# Patient Record
Sex: Female | Born: 2000 | Race: Black or African American | Hispanic: No | Marital: Single | Smoking: Never smoker
Health system: Southern US, Community
[De-identification: ages and names within clinical notes are randomized; demographics above are authoritative.]

## PROBLEM LIST (undated history)

## (undated) ENCOUNTER — Emergency Department (HOSPITAL_BASED_OUTPATIENT_CLINIC_OR_DEPARTMENT_OTHER): Payer: No Typology Code available for payment source | Source: Home / Self Care

---
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
## 2019-06-09 ENCOUNTER — Other Ambulatory Visit: Payer: Self-pay

## 2019-06-09 ENCOUNTER — Encounter (HOSPITAL_BASED_OUTPATIENT_CLINIC_OR_DEPARTMENT_OTHER): Payer: Self-pay | Admitting: *Deleted

## 2019-06-09 ENCOUNTER — Emergency Department (HOSPITAL_BASED_OUTPATIENT_CLINIC_OR_DEPARTMENT_OTHER)
Admission: EM | Admit: 2019-06-09 | Discharge: 2019-06-09 | Disposition: A | Discharge status: Home / Self Care | Payer: Medicaid Other | Attending: Emergency Medicine | Admitting: Emergency Medicine

## 2019-06-09 DIAGNOSIS — G43909 Migraine, unspecified, not intractable, without status migrainosus: Secondary | ICD-10-CM | POA: Diagnosis not present

## 2019-06-09 DIAGNOSIS — Z7722 Contact with and (suspected) exposure to environmental tobacco smoke (acute) (chronic): Secondary | ICD-10-CM | POA: Insufficient documentation

## 2019-06-09 DIAGNOSIS — R519 Headache, unspecified: Secondary | ICD-10-CM

## 2019-06-09 DIAGNOSIS — J029 Acute pharyngitis, unspecified: Secondary | ICD-10-CM | POA: Diagnosis present

## 2019-06-09 LAB — GROUP A STREP BY PCR: Group A Strep by PCR: NOT DETECTED

## 2019-06-09 MED ORDER — ACETAMINOPHEN 325 MG PO TABS
650.0000 mg | ORAL_TABLET | Freq: Once | ORAL | Status: AC | PRN
  Administered 2019-06-09: 19:00:00 650 mg via ORAL
  Filled 2019-06-09: qty 2

## 2019-06-09 NOTE — Discharge Instructions (Signed)
Please read and follow all provided instructions.  Your diagnoses today include:  1. Pharyngitis, unspecified etiology   2. Acute nonintractable headache, unspecified headache type     Tests performed today include:  Strep test: was negative for strep throat  Vital signs. See below for your results today.   Medications prescribed:  Please use over-the-counter NSAID medications (ibuprofen, naproxen) as directed on the packaging for pain.   Home care instructions:  Please read the educational materials provided and follow any instructions contained in this packet.  Follow-up instructions: Please follow-up with your primary care provider as needed for further evaluation of your symptoms.  Return instructions:   Please return to the Emergency Department if you experience worsening symptoms.   Return if you have worsening problems swallowing, your neck becomes swollen, you cannot swallow your saliva or your voice becomes muffled.   Return with high persistent fever, persistent vomiting, or if you have trouble breathing.   Please return if you have any other emergent concerns.  Additional Information:  Your vital signs today were: BP 124/78 (BP Location: Right Arm)    Pulse (!) 109    Temp 100.2 F (37.9 C) (Oral)    Resp 20    Ht 5\' 8"  (1.727 m)    Wt 86.2 kg    LMP 05/19/2019 (Approximate)    SpO2 100%    BMI 28.89 kg/m  If your blood pressure (BP) was elevated above 135/85 this visit, please have this repeated by your doctor within one month. --------------

## 2019-06-09 NOTE — ED Provider Notes (Signed)
[facility] EMERGENCY DEPARTMENT Provider Note   CSN: [id number] Arrival date & time: 06/09/19  1834     History   Chief Complaint Chief Complaint  Patient presents with  . Sore Throat  . Migraine    HPI Elizabeth Hinton is a 18 y.o. female.     Patient presents to the emergency department today with complaint of sore throat and headache.  She states that her throat has been sore for 2 days.  After the sore throat began she had a flareup of her migraines.  She denies any fevers or chills but temperature is 100.2 F here.  Patient has been taking ibuprofen and Tylenol at home with temporary relief of symptoms.  No cough.  No reported coronavirus contacts.  Headache is generalized.  She has some mild light sensitivity.  No nausea or vomiting.  No head injury reported.  No confusion.  The onset of this condition was acute. The course is constant. Aggravating factors: none. Alleviating factors: none.       History reviewed. No pertinent past medical history.  There are no active problems to display for this patient.   History reviewed. No pertinent surgical history.   OB History   No obstetric history on file.      Home Medications    Prior to Admission medications   Not on File    Family History No family history on file.  Social History Social History   Tobacco Use  . Smoking status: Passive Smoke Exposure - Never Smoker  . Smokeless tobacco: Never Used  Substance Use Topics  . Alcohol use: Yes  . Drug use: Never     Allergies   Patient has no known allergies.   Review of Systems Review of Systems  Constitutional: Positive for fatigue. Negative for chills and fever.  HENT: Positive for sore throat. Negative for congestion, dental problem, ear pain, rhinorrhea and sinus pressure.   Eyes: Positive for photophobia. Negative for discharge, redness and visual disturbance.  Respiratory: Negative for cough, shortness of breath and wheezing.    Cardiovascular: Negative for chest pain.  Gastrointestinal: Negative for abdominal pain, diarrhea, nausea and vomiting.  Genitourinary: Negative for dysuria.  Musculoskeletal: Negative for gait problem, myalgias, neck pain and neck stiffness.  Skin: Negative for rash.  Neurological: Positive for headaches. Negative for syncope, speech difficulty, weakness, light-headedness and numbness.  Hematological: Negative for adenopathy.  Psychiatric/Behavioral: Negative for confusion.     Physical Exam Updated Vital Signs BP 124/78 (BP Location: Right Arm)   Pulse (!) 109   Temp 100.2 F (37.9 C) (Oral)   Resp 20   Ht 5\' 8"  (1.727 m)   Wt 86.2 kg   LMP 05/19/2019 (Approximate)   SpO2 100%   BMI 28.89 kg/m   Physical Exam Vitals signs and nursing note reviewed.  Constitutional:      Appearance: She is well-developed.  HENT:     Head: Normocephalic and atraumatic.     Jaw: No trismus.     Right Ear: Tympanic membrane, ear canal and external ear normal.     Left Ear: Tympanic membrane, ear canal and external ear normal.     Nose: Nose normal. No mucosal edema or rhinorrhea.     Mouth/Throat:     Mouth: Mucous membranes are not dry. No oral lesions.     Pharynx: Uvula midline. Posterior oropharyngeal erythema present. No oropharyngeal exudate or uvula swelling.     Tonsils: No tonsillar exudate or tonsillar abscesses. 3+  on the right. 3+ on the left.  Eyes:     General: Lids are normal.        Right eye: No discharge.        Left eye: No discharge.     Extraocular Movements:     Right eye: No nystagmus.     Left eye: No nystagmus.     Conjunctiva/sclera: Conjunctivae normal.     Pupils: Pupils are equal, round, and reactive to light.  Neck:     Musculoskeletal: Normal range of motion and neck supple.  Cardiovascular:     Rate and Rhythm: Normal rate and regular rhythm.     Heart sounds: Normal heart sounds.  Pulmonary:     Effort: Pulmonary effort is normal. No respiratory  distress.     Breath sounds: Normal breath sounds. No wheezing or rales.  Abdominal:     Palpations: Abdomen is soft.     Tenderness: There is no abdominal tenderness.  Musculoskeletal:     Cervical back: She exhibits normal range of motion, no tenderness and no bony tenderness.  Lymphadenopathy:     Cervical: No cervical adenopathy.  Skin:    General: Skin is warm and dry.  Neurological:     Mental Status: She is alert and oriented to person, place, and time.     GCS: GCS eye subscore is 4. GCS verbal subscore is 5. GCS motor subscore is 6.     Cranial Nerves: No cranial nerve deficit.     Sensory: No sensory deficit.     Coordination: Coordination normal.     Gait: Gait normal.     Deep Tendon Reflexes: Reflexes are normal and symmetric.      ED Treatments / Results  Labs (all labs ordered are listed, but only abnormal results are displayed) Labs Reviewed  GROUP A STREP BY PCR    EKG None  Radiology No results found.  Procedures Procedures (including critical care time)  Medications Ordered in ED Medications  acetaminophen (TYLENOL) tablet 650 mg (650 mg Oral Given 06/09/19 1854)     Initial Impression / Assessment and Plan / ED Course  I have reviewed the triage vital signs and the nursing notes.  Pertinent labs & imaging results that were available during my care of the patient were reviewed by me and considered in my medical decision making (see chart for details).        Patient seen and examined. Work-up initiated.  Patient was given Tylenol.  Offered additional medications for headache, she states that her head does not really hurt that much right now and she declines.  Offered coronavirus testing if strep test is negative, she declines.  Will reassess after strep testing returns.  Vital signs reviewed and are as follows: BP 124/78 (BP Location: Right Arm)   Pulse (!) 109   Temp 100.2 F (37.9 C) (Oral)   Resp 20   Ht 5\' 8"  (1.727 m)   Wt 86.2 kg    LMP 05/19/2019 (Approximate)   SpO2 100%   BMI 28.89 kg/m   7:48 PM Patient stable.  Informed of negative strep results.  She states that she will agree to chronic testing only if it is the test that doesn't go "back to the brain".  Unfortunately we do not have these tests available.  Discussed use of Tylenol and NSAIDs.  Patient counseled on supportive care and s/s to return including worsening symptoms, trouble breathing or shortness of breath, persistent fever, persistent vomiting, or  if they have any other concerns. Urged to see PCP if symptoms persist for more than 3 days. Patient verbalizes understanding and agrees with plan.    Final Clinical Impressions(s) / ED Diagnoses   Final diagnoses:  Pharyngitis, unspecified etiology  Acute nonintractable headache, unspecified headache type   Patient with pharyngitis, negative strep.  No signs of peritonsillar abscess or other complication.  Also with headache that is currently improved after Tylenol.  No neuro symptoms.  No meningismus.  Comfortable discharged home.  Patient declines coronavirus testing or additional medication for headache at this time.   ED Discharge Orders    None       Carlisle Cater, PA-C 06/09/19 1950    Little, Wenda Overland, MD 06/09/19 [phone]

## 2019-06-09 NOTE — ED Triage Notes (Signed)
Pt reports sore throat x 2 days and recurrent migraine

## 2020-09-17 ENCOUNTER — Emergency Department (HOSPITAL_BASED_OUTPATIENT_CLINIC_OR_DEPARTMENT_OTHER): Payer: Medicaid Other

## 2020-09-17 ENCOUNTER — Other Ambulatory Visit: Payer: Self-pay

## 2020-09-17 ENCOUNTER — Encounter (HOSPITAL_BASED_OUTPATIENT_CLINIC_OR_DEPARTMENT_OTHER): Payer: Self-pay | Admitting: Emergency Medicine

## 2020-09-17 ENCOUNTER — Emergency Department (HOSPITAL_BASED_OUTPATIENT_CLINIC_OR_DEPARTMENT_OTHER)
Admission: EM | Admit: 2020-09-17 | Discharge: 2020-09-17 | Disposition: A | Discharge status: Home / Self Care | Payer: Medicaid Other | Attending: Emergency Medicine | Admitting: Emergency Medicine

## 2020-09-17 DIAGNOSIS — Z7722 Contact with and (suspected) exposure to environmental tobacco smoke (acute) (chronic): Secondary | ICD-10-CM | POA: Diagnosis not present

## 2020-09-17 DIAGNOSIS — R079 Chest pain, unspecified: Secondary | ICD-10-CM | POA: Diagnosis not present

## 2020-09-17 DIAGNOSIS — R0789 Other chest pain: Secondary | ICD-10-CM

## 2020-09-17 NOTE — ED Provider Notes (Signed)
[facility] EMERGENCY DEPARTMENT Provider Note   CSN: [id number] Arrival date & time: 09/17/20  4270     History Chief Complaint  Patient presents with  . Rib Injury    Elizabeth Hinton is a 20 y.o. female.  Patient is a 20 year old female with no significant past medical history.  Patient presents with complaints of pain to the left chest.  This has been occurring intermittently for the past several days.  It seems to begin when she breathes or moves a certain way.  The pain is sharp in nature and worse when she moves or takes a deep breath.  She denies any fevers, chills, or cough.  She denies any leg pain or swelling.  The history is provided by the patient.       History reviewed. No pertinent past medical history.  There are no problems to display for this patient.   History reviewed. No pertinent surgical history.   OB History   No obstetric history on file.     No family history on file.  Social History   Tobacco Use  . Smoking status: Passive Smoke Exposure - Never Smoker  . Smokeless tobacco: Never Used  Vaping Use  . Vaping Use: Never used  Substance Use Topics  . Alcohol use: Yes  . Drug use: Never    Home Medications Prior to Admission medications   Not on File    Allergies    Patient has no known allergies.  Review of Systems   Review of Systems  All other systems reviewed and are negative.   Physical Exam Updated Vital Signs BP 131/72 (BP Location: Left Arm)   Pulse 90   Temp 98.5 F (36.9 C) (Oral)   Resp 18   Ht 5\' 8"  (1.727 m)   Wt 90.7 kg   LMP 09/17/2020   SpO2 100%   BMI 30.41 kg/m   Physical Exam Vitals and nursing note reviewed.  Constitutional:      General: She is not in acute distress.    Appearance: She is well-developed and well-nourished. She is not diaphoretic.  HENT:     Head: Normocephalic and atraumatic.  Cardiovascular:     Rate and Rhythm: Normal rate and regular rhythm.     Heart sounds: No  murmur heard. No friction rub. No gallop.   Pulmonary:     Effort: Pulmonary effort is normal. No respiratory distress.     Breath sounds: Normal breath sounds. No wheezing.     Comments: There is tenderness to palpation of the left anterior lower ribs.  This reproduces her symptoms. Abdominal:     General: Bowel sounds are normal. There is no distension.     Palpations: Abdomen is soft.     Tenderness: There is no abdominal tenderness.  Musculoskeletal:        General: No swelling or tenderness. Normal range of motion.     Cervical back: Normal range of motion and neck supple.     Right lower leg: No edema.     Left lower leg: No edema.     Comments: 09/19/2020' sign is absent bilaterally.  Skin:    General: Skin is warm and dry.  Neurological:     Mental Status: She is alert and oriented to person, place, and time.     ED Results / Procedures / Treatments   Labs (all labs ordered are listed, but only abnormal results are displayed) Labs Reviewed - No data to display  EKG  EKG Interpretation  Date/Time:  Thursday September 17 2020 01:44:02 EST Ventricular Rate:  79 PR Interval:    QRS Duration: 88 QT Interval:  376 QTC Calculation: 431 R Axis:   40 Text Interpretation: Sinus rhythm Normal ECG Confirmed by Geoffery Lyons ([id number]) on 09/17/2020 1:46:38 AM   Radiology No results found.  Procedures Procedures   Medications Ordered in ED Medications - No data to display  ED Course  I have reviewed the triage vital signs and the nursing notes.  Pertinent labs & imaging results that were available during my care of the patient were reviewed by me and considered in my medical decision making (see chart for details).    MDM Rules/Calculators/A&P  Patient presenting here with complaints of pain to her left chest wall.  It is reproducible with palpation and most consistent with costochondritis.  Patient to be discharged with NSAIDs, rest, and return as needed.  Her EKG is  unremarkable and chest x-ray is clear.  I highly doubt a cardiac etiology and do not feel as though further work-up is indicated into this.  Patient is 20 years old and has no risk factors.  I have also considered, but highly doubt pulmonary embolism.  She has no hypoxia, no tachycardia, and symptoms are reproducible with palpation.  Final Clinical Impression(s) / ED Diagnoses Final diagnoses:  None    Rx / DC Orders ED Discharge Orders    None       Geoffery Lyons, MD 09/17/20 [phone]

## 2020-09-17 NOTE — ED Triage Notes (Signed)
Pt c/o left side rib pain that is worse upon exhaling and moving in certain ways. Pt denies any trauma or injury to area. Pt started yesterday but became worse today. Pt aaox3, ambulatory with steady gait, VSS, GCS 15, no respiratory distress noted in triage.

## 2020-09-17 NOTE — Discharge Instructions (Addendum)
Take ibuprofen 600 mg 3 times daily for the next 5 days.  Rest.  Follow-up with your primary doctor if symptoms are not improving in the next week, and return to the ER if you develop worsening pain, difficulty breathing, or other new and concerning symptoms.

## 2020-09-17 NOTE — ED Notes (Signed)
Patient transported to X-ray 

## 2021-06-01 ENCOUNTER — Emergency Department (HOSPITAL_COMMUNITY)
Admission: EM | Admit: 2021-06-01 | Discharge: 2021-06-02 | Disposition: A | Discharge status: Home / Self Care | Payer: Medicaid Other | Attending: Emergency Medicine | Admitting: Emergency Medicine

## 2021-06-01 ENCOUNTER — Other Ambulatory Visit: Payer: Self-pay

## 2021-06-01 ENCOUNTER — Encounter (HOSPITAL_COMMUNITY): Payer: Self-pay | Admitting: Emergency Medicine

## 2021-06-01 DIAGNOSIS — Z20822 Contact with and (suspected) exposure to covid-19: Secondary | ICD-10-CM | POA: Diagnosis not present

## 2021-06-01 DIAGNOSIS — Z7722 Contact with and (suspected) exposure to environmental tobacco smoke (acute) (chronic): Secondary | ICD-10-CM | POA: Diagnosis not present

## 2021-06-01 DIAGNOSIS — J101 Influenza due to other identified influenza virus with other respiratory manifestations: Secondary | ICD-10-CM | POA: Diagnosis not present

## 2021-06-01 DIAGNOSIS — R509 Fever, unspecified: Secondary | ICD-10-CM | POA: Diagnosis present

## 2021-06-01 LAB — RESP PANEL BY RT-PCR (FLU A&B, COVID) ARPGX2
Influenza A by PCR: POSITIVE — AB
Influenza B by PCR: NEGATIVE
SARS Coronavirus 2 by RT PCR: NEGATIVE

## 2021-06-01 MED ORDER — IBUPROFEN 400 MG PO TABS
400.0000 mg | ORAL_TABLET | Freq: Once | ORAL | Status: AC
  Administered 2021-06-02: 400 mg via ORAL
  Filled 2021-06-01: qty 1

## 2021-06-01 NOTE — ED Triage Notes (Signed)
Patient reports fever with fatigue/generalized weakness / jaw pain onset this afternoon .

## 2021-06-01 NOTE — ED Provider Notes (Signed)
Emergency Medicine Provider Triage Evaluation Note  Elizabeth Hinton , a 20 y.o. female  was evaluated in triage.  Pt complains of fever and fatigue, generalized weakness.  She states that this started at noon today.  She has had associated sore throat and subjective shortness of breath.  She states that she did not take her temperature at home just felt warm.  She states that her siblings were sick 1 week ago with the flu.  She denies any cough, congestion, nausea, vomiting or diarrhea.  She is still tolerating fluids and solid food.  Review of Systems  Positive: Fever Negative: Cough  Physical Exam  Ht 5\' 8"  (1.727 m)   Wt 100 kg   BMI 33.52 kg/m  Gen:   Awake, no distress   Resp:  Normal effort, lung sounds clear MSK:   Moves extremities without difficulty  Other:    Medical Decision Making  Medically screening exam initiated at 8:52 PM.  Appropriate orders placed.  Ariell Gunnels was informed that the remainder of the evaluation will be completed by another provider, this initial triage assessment does not replace that evaluation, and the importance of remaining in the ED until their evaluation is complete.     Trudee Grip, PA-C 06/01/21 2053    2054, MD 06/02/21 [phone]

## 2021-06-02 NOTE — ED Provider Notes (Signed)
[facility] EMERGENCY DEPARTMENT Provider Note   CSN: [id number] Arrival date & time: 06/01/21  1956     History Chief Complaint  Patient presents with   Fatigue Maia Plan    Noreta Kue is a 20 y.o. female.  The history is provided by the patient.  Influenza Presenting symptoms: fatigue, fever, headache, myalgias, nausea and sore throat   Presenting symptoms: no cough, no diarrhea, no shortness of breath and no vomiting   Severity:  Moderate Onset quality:  Gradual Duration:  12 hours Progression:  Worsening Chronicity:  New Relieved by:  Nothing Worsened by:  Nothing Associated symptoms: chills       PMH-none OB History   No obstetric history on file.     No family history on file.  Social History   Tobacco Use   Smoking status: Passive Smoke Exposure - Never Smoker   Smokeless tobacco: Never  Vaping Use   Vaping Use: Never used  Substance Use Topics   Alcohol use: Yes   Drug use: Never    Home Medications Prior to Admission medications   Not on File    Allergies    Patient has no known allergies.  Review of Systems   Review of Systems  Constitutional:  Positive for chills, fatigue and fever.  HENT:  Positive for sore throat.   Respiratory:  Negative for cough and shortness of breath.   Gastrointestinal:  Positive for nausea. Negative for diarrhea and vomiting.  Musculoskeletal:  Positive for myalgias.  Neurological:  Positive for headaches.  All other systems reviewed and are negative.  Physical Exam Updated Vital Signs BP 134/75 (BP Location: Left Arm)   Pulse (!) 103   Temp (!) 100.4 F (38 C) (Oral)   Resp 20   Ht 1.727 m (5\' 8" )   Wt 100 kg   SpO2 100%   BMI 33.52 kg/m   Physical Exam CONSTITUTIONAL: Well developed/well nourished HEAD: Normocephalic/atraumatic EYES: EOMI/PERRL ENMT: Mucous membranes moist, uvula midline no erythema or exudate NECK: supple no meningeal signs SPINE/BACK:entire spine  nontender CV: S1/S2 noted, no murmurs/rubs/gallops noted LUNGS: Lungs are clear to auscultation bilaterally, no apparent distress ABDOMEN: soft, nontender NEURO: Pt is awake/alert/appropriate, moves all extremitiesx4.  No facial droop.   EXTREMITIES: pulses normal/equal, full ROM SKIN: warm, color normal PSYCH: no abnormalities of mood noted, alert and oriented to situation  ED Results / Procedures / Treatments   Labs (all labs ordered are listed, but only abnormal results are displayed) Labs Reviewed  RESP PANEL BY RT-PCR (FLU A&B, COVID) ARPGX2 - Abnormal; Notable for the following components:      Result Value   Influenza A by PCR POSITIVE (*)    All other components within normal limits    EKG None  Radiology No results found.  Procedures Procedures   Medications Ordered in ED Medications  ibuprofen (ADVIL) tablet 400 mg (has no administration in time range)    ED Course  I have reviewed the triage vital signs and the nursing notes.  Pertinent labs  results that were available during my care of the patient were reviewed by me and considered in my medical decision making (see chart for details).    MDM Rules/Calculators/A&P                           Patient is otherwise healthy female presenting with flulike illness.  Patient is positive for influenza A.  She is safe for  discharge home and outpatient management.  She has no hypoxia.  No signs of meningitis. We discussed strict  return precautions Final Clinical Impression(s) / ED Diagnoses Final diagnoses:  Influenza A    Rx / DC Orders ED Discharge Orders     None        Zadie Rhine, MD 06/02/21 0025

## 2021-09-09 ENCOUNTER — Other Ambulatory Visit: Payer: Self-pay

## 2021-09-09 ENCOUNTER — Emergency Department (HOSPITAL_BASED_OUTPATIENT_CLINIC_OR_DEPARTMENT_OTHER): Payer: Medicaid Other | Admitting: Radiology

## 2021-09-09 ENCOUNTER — Emergency Department (HOSPITAL_BASED_OUTPATIENT_CLINIC_OR_DEPARTMENT_OTHER)
Admission: EM | Admit: 2021-09-09 | Discharge: 2021-09-09 | Disposition: A | Discharge status: Home / Self Care | Payer: Medicaid Other | Attending: Emergency Medicine | Admitting: Emergency Medicine

## 2021-09-09 ENCOUNTER — Encounter (HOSPITAL_BASED_OUTPATIENT_CLINIC_OR_DEPARTMENT_OTHER): Payer: Self-pay | Admitting: Emergency Medicine

## 2021-09-09 DIAGNOSIS — M79672 Pain in left foot: Secondary | ICD-10-CM | POA: Insufficient documentation

## 2021-09-09 MED ORDER — NAPROXEN 500 MG PO TABS: 500.0000 mg | ORAL_TABLET | Freq: Two times a day (BID) | ORAL | 0 refills | Status: DC

## 2021-09-09 NOTE — ED Provider Notes (Signed)
[facility] EMERGENCY DEPT Provider Note   CSN: [id number] Arrival date & time: 09/09/21  1412    History  Chief Complaint  Patient presents with   Foot Pain    Elizabeth Hinton is a 21 y.o. female no significant past medical history here for evaluation of left foot pain.  Initially injured foot approximately 2 months ago.  Has had intermittent pain to the plantar aspect of foot since.  Pain does not extend into ankle, tib-fib.  Describes as aching and throbbing to the left medial aspect plantar foot.  No paresthesias, weakness, redness, warmth, numbness.  No meds PTA. Pain worse after standing for long periods or when first getting up in the morning  HPI     Home Medications Prior to Admission medications   Medication Sig Start Date End Date Taking? Authorizing Provider  naproxen (NAPROSYN) 500 MG tablet Take 1 tablet (500 mg total) by mouth 2 (two) times daily. 09/09/21  Yes Damir Leung A, PA-C      Allergies    Patient has no known allergies.    Review of Systems   Review of Systems  Constitutional: Negative.   HENT: Negative.    Respiratory: Negative.    Cardiovascular: Negative.   Gastrointestinal: Negative.   Genitourinary: Negative.   Musculoskeletal:        Left foot pain  Skin: Negative.   Neurological: Negative.   All other systems reviewed and are negative.  Physical Exam Updated Vital Signs BP 136/68 (BP Location: Right Arm)    Pulse 74    Temp 98.8 F (37.1 C) (Oral)    Resp 17    Ht 5\' 8"  (1.727 m)    Wt 90.7 kg    SpO2 100%    BMI 30.41 kg/m  Physical Exam Vitals and nursing note reviewed.  Constitutional:      General: She is not in acute distress.    Appearance: She is well-developed. She is not ill-appearing, toxic-appearing or diaphoretic.  HENT:     Head: Normocephalic and atraumatic.     Nose: Nose normal.     Mouth/Throat:     Mouth: Mucous membranes are moist.  Eyes:     Pupils: Pupils are equal, round, and reactive to  light.  Cardiovascular:     Rate and Rhythm: Normal rate.     Pulses: Normal pulses.          Dorsalis pedis pulses are 2+ on the right side and 2+ on the left side.       Posterior tibial pulses are 2+ on the right side and 2+ on the left side.  Pulmonary:     Effort: No respiratory distress.  Abdominal:     General: There is no distension.  Musculoskeletal:        General: Normal range of motion.     Cervical back: Normal range of motion.       Feet:     Comments: Minimal tenderness to left lower extremity medial plantar fascia.  Nontender bilateral ankles, tib-fib.  Full range of motion without difficulty.  Feet:     Right foot:     Skin integrity: Skin integrity normal.     Left foot:     Skin integrity: Skin integrity normal.     Comments: Pes planus bilaterally Skin:    General: Skin is warm and dry.     Capillary Refill: Capillary refill takes less than 2 seconds.     Comments: No edema, erythema, warmth,  fluctuance or induration.  Neurological:     General: No focal deficit present.     Mental Status: She is alert and oriented to person, place, and time.     Cranial Nerves: Cranial nerves 2-12 are intact.     Sensory: Sensation is intact.     Motor: Motor function is intact.     Gait: Gait is intact.  Psychiatric:        Mood and Affect: Mood normal.    ED Results / Procedures / Treatments   Labs (all labs ordered are listed, but only abnormal results are displayed) Labs Reviewed - No data to display  EKG None  Radiology DG Foot Complete Left  Result Date: 09/09/2021 CLINICAL DATA:  Left foot injury EXAM: LEFT FOOT - COMPLETE 3+ VIEW COMPARISON:  None. FINDINGS: There is no evidence of fracture or dislocation. There is no evidence of arthropathy or other focal bone abnormality. Soft tissues are unremarkable. IMPRESSION: No acute osseous abnormality identified. Electronically Signed   By: Jannifer Hick M.D.   On: 09/09/2021 15:08    Procedures Procedures     Medications Ordered in ED Medications - No data to display  ED Course/ Medical Decision Making/ A&P    21 year old here for evaluation of left plantar aspect foot pain x 2 months.  Worse after standing as well as in the morning after being off her foot.  Apparently had injury at initial onset.  Patient neurovascularly intact.  No edema, erythema or warmth.  Full range of motion.  Does have some mild tenderness at plantar medial aspect left foot.  No overlying skin changes to suggest cellulitis. Does have severe pes planus.  Labs and imaging personally reviewed and interpreted:  Xray left foot without acute abnormality  Patient reassessed.  Discussed imaging results.  Suspect patient's pain from plantar fasciitis versus pes planus.  Discussed RICE, frozen water bottle rolls, anti-inflammatories, good shoe inserts with supportive arch, close follow-up with podiatry.  Patient agreeable.  At this time I have low suspicion for acute fracture, dislocation, ischemia, bacterial infectious process, vte.  The patient has been appropriately medically screened and/or stabilized in the ED. I have low suspicion for any other emergent medical condition which would require further screening, evaluation or treatment in the ED or require inpatient management.  Patient is hemodynamically stable and in no acute distress.  Patient able to ambulate in department prior to ED.  Evaluation does not show acute pathology that would require ongoing or additional emergent interventions while in the emergency department or further inpatient treatment.  I have discussed the diagnosis with the patient and answered all questions.  Pain is been managed while in the emergency department and patient has no further complaints prior to discharge.  Patient is comfortable with plan discussed in room and is stable for discharge at this time.  I have discussed strict return precautions for returning to the emergency department.   Patient was encouraged to follow-up with PCP/specialist refer to at discharge.                            Medical Decision Making Amount and/or Complexity of Data Reviewed External Data Reviewed: labs, radiology and notes. Radiology: ordered and independent interpretation performed. Decision-making details documented in ED Course.  Risk OTC drugs. Prescription drug management. Risk Details: Do not feel patient needs additional labs, imaging, hospitalization at this time  Final Clinical Impression(s) / ED Diagnoses Final diagnoses:  Left foot pain    Rx / DC Orders ED Discharge Orders          Ordered    naproxen (NAPROSYN) 500 MG tablet  2 times daily        09/09/21 1528              Bryony Kaman A, PA-C 09/09/21 1538    Long, Arlyss Repress, MD 09/10/21 [phone]

## 2021-09-09 NOTE — Discharge Instructions (Addendum)
Use the anti-inflammatory as prescribed.  Keep your foot elevated, ice, follow-up with primary care provider or orthopedics if your symptoms do not improve  Return for new or worsening symptoms

## 2021-09-09 NOTE — ED Triage Notes (Signed)
Pt arrives to ED with c/o left foot pain. She reports this x2 months ago after landing on the foot and twisting it. The pain is on the right side of the foot. Pt reports the pain has worsened this week. The pain is described as throbbing.

## 2021-12-30 ENCOUNTER — Encounter (HOSPITAL_BASED_OUTPATIENT_CLINIC_OR_DEPARTMENT_OTHER): Payer: Self-pay | Admitting: *Deleted

## 2021-12-30 ENCOUNTER — Emergency Department (HOSPITAL_BASED_OUTPATIENT_CLINIC_OR_DEPARTMENT_OTHER)
Admission: EM | Admit: 2021-12-30 | Discharge: 2021-12-30 | Disposition: A | Discharge status: Home / Self Care | Payer: Medicaid Other | Attending: Emergency Medicine | Admitting: Emergency Medicine

## 2021-12-30 ENCOUNTER — Other Ambulatory Visit: Payer: Self-pay

## 2021-12-30 DIAGNOSIS — M546 Pain in thoracic spine: Secondary | ICD-10-CM | POA: Diagnosis not present

## 2021-12-30 DIAGNOSIS — Y92410 Unspecified street and highway as the place of occurrence of the external cause: Secondary | ICD-10-CM | POA: Insufficient documentation

## 2021-12-30 DIAGNOSIS — M25511 Pain in right shoulder: Secondary | ICD-10-CM | POA: Diagnosis not present
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
## 2021-12-30 NOTE — Discharge Instructions (Addendum)
You may use over-the-counter Motrin (Ibuprofen), Acetaminophen (Tylenol), topical muscle creams such as SalonPas, Icy Hot, Bengay, etc. Please stretch, apply ice or heat (whichever helps), and have massage therapy for additional assistance.  

## 2023-04-08 ENCOUNTER — Emergency Department (HOSPITAL_BASED_OUTPATIENT_CLINIC_OR_DEPARTMENT_OTHER)
Admission: EM | Admit: 2023-04-08 | Discharge: 2023-04-08 | Disposition: A | Discharge status: Home / Self Care | Payer: Self-pay | Attending: Emergency Medicine | Admitting: Emergency Medicine

## 2023-04-08 ENCOUNTER — Emergency Department (HOSPITAL_BASED_OUTPATIENT_CLINIC_OR_DEPARTMENT_OTHER): Payer: Self-pay

## 2023-04-08 ENCOUNTER — Other Ambulatory Visit: Payer: Self-pay

## 2023-04-08 ENCOUNTER — Encounter (HOSPITAL_BASED_OUTPATIENT_CLINIC_OR_DEPARTMENT_OTHER): Payer: Self-pay

## 2023-04-08 DIAGNOSIS — S9782XA Crushing injury of left foot, initial encounter: Secondary | ICD-10-CM | POA: Insufficient documentation

## 2023-04-08 DIAGNOSIS — W208XXA Other cause of strike by thrown, projected or falling object, initial encounter: Secondary | ICD-10-CM | POA: Insufficient documentation

## 2023-04-08 MED ORDER — NAPROXEN 500 MG PO TABS: 500.0000 mg | ORAL_TABLET | Freq: Two times a day (BID) | ORAL | 0 refills | Status: AC

## 2023-04-08 NOTE — Discharge Instructions (Signed)
The x-ray of your left foot did not show any fracture or dislocation.  You were given a referral for orthopedics if you wish to have more follow-up.  Please take the anti-inflammatory that were prescribed to you 1 tablet twice a day for the next 7 days with food.

## 2023-04-08 NOTE — ED Provider Notes (Signed)
[place] EMERGENCY DEPARTMENT AT [facility] Provider Note   CSN: [id number] Arrival date & time: 04/08/23  1753     History  Chief Complaint  Patient presents with   Foot Injury    left    Elizabeth Hinton is a 22 y.o. female.  22 year old female with no past medical history presents to the ED status post left foot injury which occurred 2 weeks ago.  Patient reports she dropped a heavy rotary onto her left foot.  Since then she has had pain to the dorsum aspect, has a visible knot to the area.  Exacerbated with movement along with weightbearing.  Has taken some ibuprofen with some improvement in symptoms.  No numbness sensation, no tingling, no fevers.  The history is provided by the patient.  Foot Injury Associated symptoms: no fever        Home Medications Prior to Admission medications   Medication Sig Start Date End Date Taking? Authorizing Provider  naproxen (NAPROSYN) 500 MG tablet Take 1 tablet (500 mg total) by mouth 2 (two) times daily for 7 days. 04/08/23 04/15/23 Yes Claude Manges, PA-C      Allergies    Patient has no known allergies.    Review of Systems   Review of Systems  Constitutional:  Negative for fever.  Musculoskeletal:  Positive for arthralgias.    Physical Exam Updated Vital Signs BP 128/79 (BP Location: Right Arm)   Pulse 86   Temp 98 F (36.7 C)   Resp 20   Ht 5\' 8"  (1.727 m)   Wt 95.3 kg   SpO2 99%   BMI 31.93 kg/m  Physical Exam Vitals and nursing note reviewed.  Constitutional:      Appearance: Normal appearance.  HENT:     Head: Normocephalic and atraumatic.     Mouth/Throat:     Mouth: Mucous membranes are moist.  Cardiovascular:     Rate and Rhythm: Normal rate.     Pulses:          Popliteal pulses are 2+ on the left side.       Dorsalis pedis pulses are 2+ on the left side.       Posterior tibial pulses are 2+ on the left side.  Pulmonary:     Effort: Pulmonary effort is normal.  Abdominal:     General:  Abdomen is flat.  Musculoskeletal:     Cervical back: Normal range of motion and neck supple.  Feet:     Left foot:     Skin integrity: Skin integrity normal. No skin breakdown or erythema.     Comments: 2+ DP, PT pulses, capillary refill is intact.  Sensation is intact throughout. Skin:    General: Skin is warm and dry.  Neurological:     Mental Status: She is alert and oriented to person, place, and time.     ED Results / Procedures / Treatments   Labs (all labs ordered are listed, but only abnormal results are displayed) Labs Reviewed - No data to display  EKG None  Radiology DG Foot Complete Left  Result Date: 04/08/2023 CLINICAL DATA:  Left foot pain. Dropped a heavy rug onto her foot 2 weeks ago. EXAM: LEFT FOOT - COMPLETE 3+ VIEW COMPARISON:  09/09/2021. FINDINGS: There is no evidence of fracture or dislocation. There is no evidence of arthropathy or other focal bone abnormality. Soft tissues are unremarkable. IMPRESSION: Negative. Electronically Signed   By: Amie Portland M.D.   On: 04/08/2023 18:53  Procedures Procedures    Medications Ordered in ED Medications - No data to display  ED Course/ Medical Decision Making/ A&P                                 Medical Decision Making Amount and/or Complexity of Data Reviewed Radiology: ordered.  Risk Prescription drug management.    Patient here status post left foot injury approximately 2 weeks ago, no visible erythema, hematoma noted on my exam.  She is neurovascularly intact.  X-ray does not show any dislocation versus fracture.  She does have a knot to the dorsum aspect of her foot.  We discussed the results of her xray at length, she will go home with an Ace wrap, close follow-up with orthopedics if symptoms worsen.  She is agreeable to plan and treatment, hemodynamically stable for discharge.  Portions of this note were generated with Scientist, clinical (histocompatibility and immunogenetics). Dictation errors may occur despite best  attempts at proofreading.   Final Clinical Impression(s) / ED Diagnoses Final diagnoses:  Crushing injury of left foot, initial encounter    Rx / DC Orders ED Discharge Orders          Ordered    naproxen (NAPROSYN) 500 MG tablet  2 times daily        04/08/23 1915              Claude Manges, PA-C 04/08/23 1919    Charlynne Pander, MD 04/08/23 2306

## 2023-04-08 NOTE — ED Triage Notes (Signed)
Patient arrives with complaints of left foot pain due to dropping a heavy rug on her foot 2 weeks ago.

## 2023-12-18 IMAGING — DX DG FOOT COMPLETE 3+V*L*
3 series · 3 of 3 positions shown · non-contrast
Comparison: None.

CLINICAL DATA: Left foot injury

EXAM:
LEFT FOOT - COMPLETE 3+ VIEW

[foot ap]
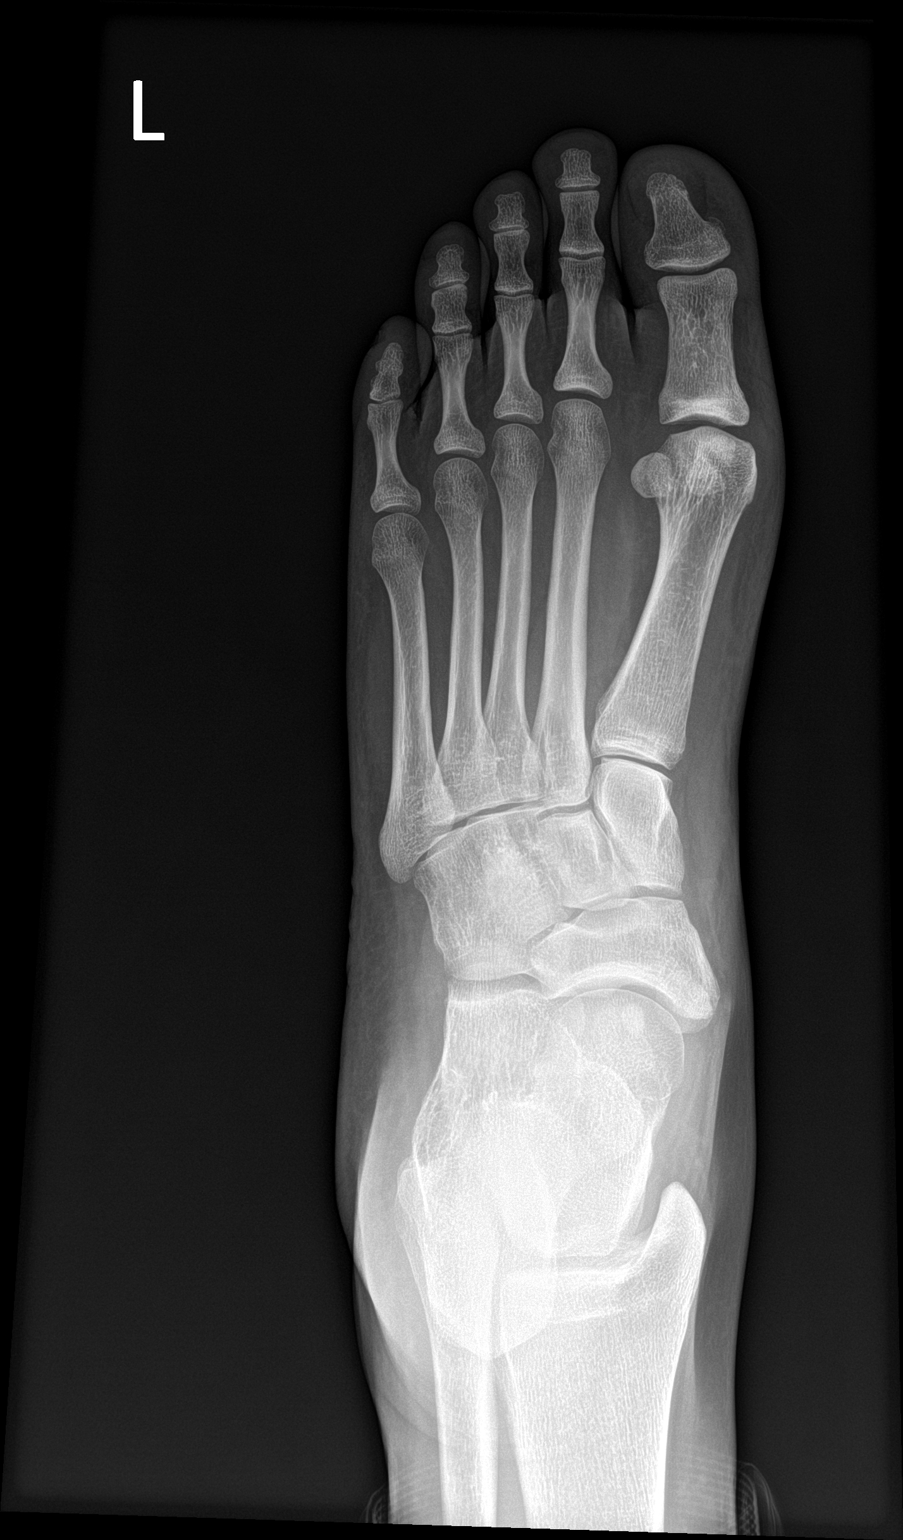

[foot obl]
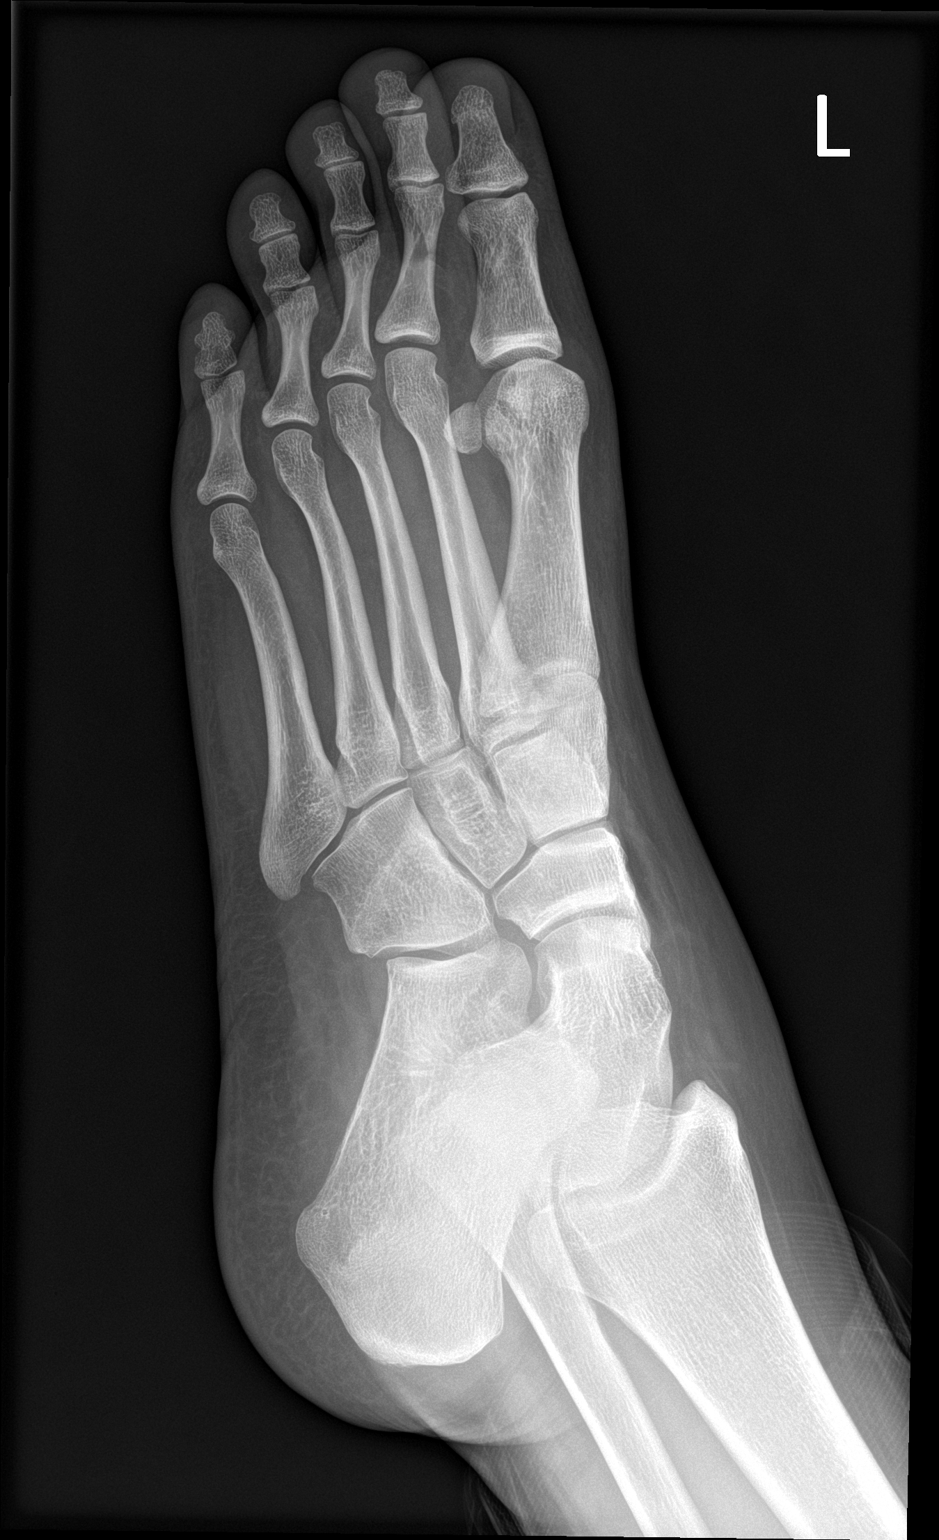

[foot lat]
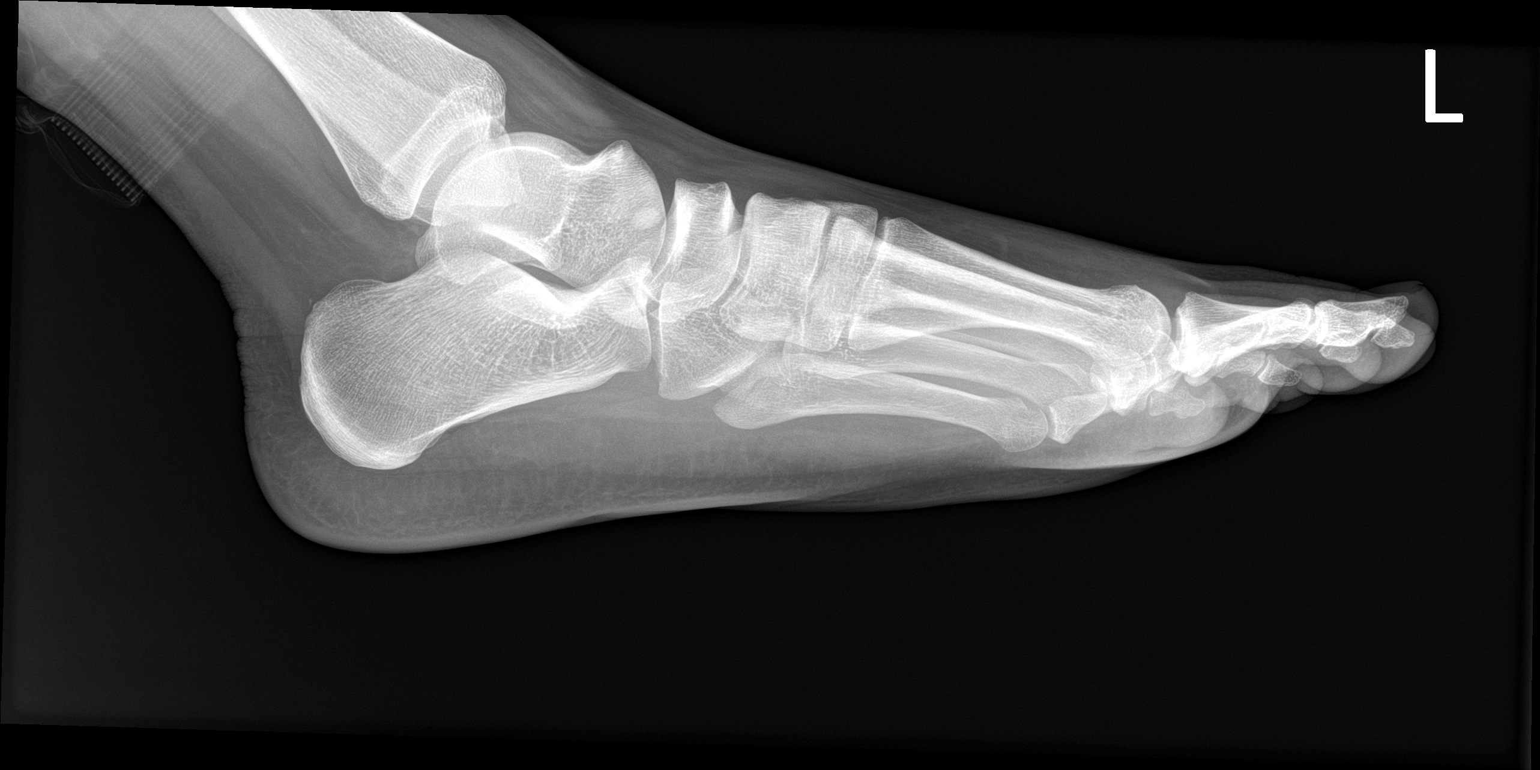

[3 of 3 positions shown; findings below may reference images not displayed]

FINDINGS: There is no evidence of fracture or dislocation. There is no
evidence of arthropathy or other focal bone abnormality. Soft
tissues are unremarkable.
IMPRESSION: No acute osseous abnormality identified.

## 2024-03-03 ENCOUNTER — Encounter (HOSPITAL_BASED_OUTPATIENT_CLINIC_OR_DEPARTMENT_OTHER): Payer: Self-pay

## 2024-03-03 ENCOUNTER — Emergency Department (HOSPITAL_BASED_OUTPATIENT_CLINIC_OR_DEPARTMENT_OTHER): Admission: EM | Admit: 2024-03-03 | Discharge: 2024-03-03 | Disposition: A | Discharge status: Home / Self Care

## 2024-03-03 DIAGNOSIS — U071 COVID-19: Secondary | ICD-10-CM | POA: Insufficient documentation

## 2024-03-03 DIAGNOSIS — R509 Fever, unspecified: Secondary | ICD-10-CM | POA: Diagnosis present

## 2024-03-03 LAB — RESP PANEL BY RT-PCR (RSV, FLU A&B, COVID)  RVPGX2
Influenza A by PCR: NEGATIVE
Influenza B by PCR: NEGATIVE
Resp Syncytial Virus by PCR: NEGATIVE
SARS Coronavirus 2 by RT PCR: POSITIVE — AB

## 2024-03-03 LAB — GROUP A STREP BY PCR: Group A Strep by PCR: NOT DETECTED

## 2024-03-03 MED ORDER — ACETAMINOPHEN 500 MG PO TABS
1000.0000 mg | ORAL_TABLET | Freq: Once | ORAL | Status: AC
  Administered 2024-03-03: 1000 mg via ORAL
  Filled 2024-03-03: qty 2

## 2024-03-03 MED ORDER — BENZONATATE 100 MG PO CAPS
100.0000 mg | ORAL_CAPSULE | Freq: Three times a day (TID) | ORAL | 0 refills | Status: AC
Start: 2024-03-03 — End: ?

## 2024-03-03 NOTE — ED Provider Notes (Signed)
 [place] EMERGENCY DEPARTMENT AT [facility] Provider Note   CSN: [id number] Arrival date & time: 03/03/24  9067     Patient presents with: Fever   Elizabeth Hinton is a 23 y.o. female.   Patient with no pertinent past medical history presents today with complaints of bodyaches and fevers with a mild sore throat.  Reports that the symptoms began yesterday.  She took 1 dose of equate headache relief yesterday with improvement, has not had any medications today.  Denies any known sick contacts.  The history is provided by the patient. No language interpreter was used.  Fever      Prior to Admission medications   Not on File    Allergies: Patient has no known allergies.    Review of Systems  Constitutional:  Positive for fever.  All other systems reviewed and are negative.   Updated Vital Signs BP 107/71   Pulse 84   Temp (!) 101.3 F (38.5 C) (Oral)   Resp 16   LMP 02/13/2024 (Approximate)   SpO2 96%   Physical Exam Vitals and nursing note reviewed.  Constitutional:      General: She is not in acute distress.    Appearance: Normal appearance. She is normal weight. She is not ill-appearing, toxic-appearing or diaphoretic.  HENT:     Head: Normocephalic and atraumatic.     Mouth/Throat:     Mouth: Mucous membranes are moist.     Pharynx: No oropharyngeal exudate or posterior oropharyngeal erythema.     Comments: Normal phonation, tolerating secretions Neck:     Comments: No meningismus  Cardiovascular:     Rate and Rhythm: Normal rate and regular rhythm.     Heart sounds: Normal heart sounds.  Pulmonary:     Effort: Pulmonary effort is normal. No respiratory distress.     Breath sounds: Normal breath sounds.  Abdominal:     General: Abdomen is flat.     Palpations: Abdomen is soft.     Tenderness: There is no abdominal tenderness.  Musculoskeletal:        General: Normal range of motion.     Cervical back: Normal range of motion and neck supple.   Skin:    General: Skin is warm and dry.  Neurological:     General: No focal deficit present.     Mental Status: She is alert.  Psychiatric:        Mood and Affect: Mood normal.        Behavior: Behavior normal.     (all labs ordered are listed, but only abnormal results are displayed) Labs Reviewed  RESP PANEL BY RT-PCR (RSV, FLU A&B, COVID)  RVPGX2 - Abnormal; Notable for the following components:      Result Value   SARS Coronavirus 2 by RT PCR POSITIVE (*)    All other components within normal limits  GROUP A STREP BY PCR    EKG: None  Radiology: No results found.   Procedures   Medications Ordered in the ED  acetaminophen  (TYLENOL ) tablet 1,000 mg (1,000 mg Oral Given 03/03/24 1023)                                    Medical Decision Making Risk OTC drugs.   Patient presents today with complaints of cough and congestion x 1 day.  They are febrile, however nontoxic-appearing, and in no acute distress with reassuring vital signs.  Physical exam reveals lung sounds clear to auscultation in all fields. No indication for CXR imaging at this time. Patient positive for COVID, symptoms likely related to this. Given Tylenol  with improvement in fever, recommend continued Tylenol /ibuprofen  use.  Discussed with patient that there is no indication for antibiotics for viral infections. Will send for Tessalon  for cough with OTC recommendations as well. Recommend close outpatient follow-up with return precautions. Evaluation and diagnostic testing in the emergency department does not suggest an emergent condition requiring admission or immediate intervention beyond what has been performed at this time.  Plan for discharge with close PCP follow-up.  Patient is understanding and amenable with plan, educated on red flag symptoms that would prompt immediate return.  Patient discharged in stable condition.  Final diagnoses:  COVID    ED Discharge Orders          Ordered     benzonatate  (TESSALON ) 100 MG capsule  Every 8 hours        03/03/24 1207          An After Visit Summary was printed and given to the patient.      Nichlas Pitera A, PA-C 03/03/24 1207    Gennaro Bouchard L, DO 03/06/24 [phone]

## 2024-03-03 NOTE — ED Triage Notes (Signed)
 She  c/o fever with mild sore throat and body aches since yesterday. She denies any cough/n/v/d and is in no distress.

## 2024-03-03 NOTE — Discharge Instructions (Addendum)
 As we discussed, you tested positive for COVID today.  Given that this is a virus, no antibiotics are indicated.  I recommend that you get plenty of rest and focus on symptomatic relief which includes Cepacol throat lozenges for sore throat, Mucinex D (orange box) which you can get from behind the counter at your local pharmacy for congestion, and tylenol /ibuprofen  as needed for fevers and bodyaches.   Please use acetaminophen  (Tylenol ) or ibuprofen  (Advil , Motrin ) for pain.  You may use 800 mg ibuprofen  every 6 hours or 1000 mg of acetaminophen  every 6 hours.  You may choose to alternate between the two, this would be most effective. Do not exceed 4000 mg of acetaminophen  within 24 hours.  Do not exceed 3200 mg ibuprofen  within 24 hours.  I have also given you a prescription for Tessalon  which is a cough suppressant medication for you to take as prescribed as needed for management of your symptoms. I also recommend:  Increased fluid intake. Sports drinks offer valuable electrolytes, sugars, and fluids.  Breathing heated mist or steam (vaporizer or shower).  Eating chicken soup or other clear broths, and maintaining good nutrition.   Increasing usage of your inhaler if you have asthma.  Return to work when your temperature has returned to normal.  Gargle warm salt water and spit it out for sore throat. Take benadryl or Zyrtec to decrease sinus secretions.  Follow Up: Follow up with your primary care doctor in 5-7 days for recheck of ongoing symptoms.  Return to emergency department for emergent changing or worsening of symptoms.
